# Patient Record
Sex: Female | Born: 1970 | Race: White | Hispanic: No | Marital: Married | Smoking: Never smoker
Health system: Southern US, Community
[De-identification: ages and names within clinical notes are randomized; demographics above are authoritative.]

## PROBLEM LIST (undated history)

## (undated) DIAGNOSIS — E669 Obesity, unspecified: Secondary | ICD-10-CM

## (undated) DIAGNOSIS — E119 Type 2 diabetes mellitus without complications: Secondary | ICD-10-CM

## (undated) DIAGNOSIS — K219 Gastro-esophageal reflux disease without esophagitis: Secondary | ICD-10-CM

## (undated) DIAGNOSIS — I10 Essential (primary) hypertension: Secondary | ICD-10-CM

## (undated) HISTORY — DX: Type 2 diabetes mellitus without complications: E11.9

## (undated) HISTORY — DX: Gastro-esophageal reflux disease without esophagitis: K21.9

## (undated) HISTORY — DX: Essential (primary) hypertension: I10

## (undated) HISTORY — PX: CHOLECYSTECTOMY: SHX55

## (undated) HISTORY — PX: WISDOM TOOTH EXTRACTION: SHX21

## (undated) HISTORY — PX: OTHER SURGICAL HISTORY: SHX169

## (undated) HISTORY — DX: Obesity, unspecified: E66.9

---

## 1998-04-11 ENCOUNTER — Other Ambulatory Visit: Admission: RE | Admit: 1998-04-11 | Discharge: 1998-04-11 | Payer: Self-pay | Admitting: *Deleted

## 1998-10-04 ENCOUNTER — Other Ambulatory Visit: Admission: RE | Admit: 1998-10-04 | Discharge: 1998-10-04 | Payer: Self-pay | Admitting: *Deleted

## 1998-11-16 ENCOUNTER — Other Ambulatory Visit: Admission: RE | Admit: 1998-11-16 | Discharge: 1998-11-16 | Payer: Self-pay | Admitting: *Deleted

## 1998-12-06 ENCOUNTER — Ambulatory Visit (HOSPITAL_COMMUNITY): Admission: RE | Admit: 1998-12-06 | Discharge: 1998-12-06 | Payer: Self-pay | Admitting: *Deleted

## 1999-04-18 ENCOUNTER — Ambulatory Visit (HOSPITAL_COMMUNITY): Admission: RE | Admit: 1999-04-18 | Discharge: 1999-04-18 | Payer: Self-pay | Admitting: Gastroenterology

## 1999-05-10 ENCOUNTER — Ambulatory Visit (HOSPITAL_COMMUNITY): Admission: RE | Admit: 1999-05-10 | Discharge: 1999-05-10 | Payer: Self-pay | Admitting: Obstetrics and Gynecology

## 1999-05-30 ENCOUNTER — Other Ambulatory Visit: Admission: RE | Admit: 1999-05-30 | Discharge: 1999-05-30 | Payer: Self-pay | Admitting: *Deleted

## 1999-06-24 ENCOUNTER — Other Ambulatory Visit: Admission: RE | Admit: 1999-06-24 | Discharge: 1999-06-24 | Payer: Self-pay | Admitting: *Deleted

## 1999-10-07 ENCOUNTER — Other Ambulatory Visit: Admission: RE | Admit: 1999-10-07 | Discharge: 1999-10-07 | Payer: Self-pay | Admitting: *Deleted

## 2000-04-30 ENCOUNTER — Ambulatory Visit (HOSPITAL_COMMUNITY): Admission: RE | Admit: 2000-04-30 | Discharge: 2000-04-30 | Payer: Self-pay | Admitting: *Deleted

## 2000-04-30 ENCOUNTER — Encounter (INDEPENDENT_AMBULATORY_CARE_PROVIDER_SITE_OTHER): Payer: Self-pay

## 2000-05-20 ENCOUNTER — Other Ambulatory Visit: Admission: RE | Admit: 2000-05-20 | Discharge: 2000-05-20 | Payer: Self-pay | Admitting: *Deleted

## 2001-06-07 ENCOUNTER — Inpatient Hospital Stay (HOSPITAL_COMMUNITY): Admission: AD | Admit: 2001-06-07 | Discharge: 2001-06-10 | Payer: Self-pay | Admitting: *Deleted

## 2001-07-16 ENCOUNTER — Other Ambulatory Visit: Admission: RE | Admit: 2001-07-16 | Discharge: 2001-07-16 | Payer: Self-pay | Admitting: *Deleted

## 2002-02-17 ENCOUNTER — Other Ambulatory Visit: Admission: RE | Admit: 2002-02-17 | Discharge: 2002-02-17 | Payer: Self-pay | Admitting: *Deleted

## 2002-06-10 ENCOUNTER — Encounter: Payer: Self-pay | Admitting: Obstetrics & Gynecology

## 2002-06-10 ENCOUNTER — Inpatient Hospital Stay (HOSPITAL_COMMUNITY): Admission: AD | Admit: 2002-06-10 | Discharge: 2002-06-10 | Payer: Self-pay | Admitting: Family Medicine

## 2002-09-14 ENCOUNTER — Inpatient Hospital Stay (HOSPITAL_COMMUNITY): Admission: AD | Admit: 2002-09-14 | Discharge: 2002-09-16 | Payer: Self-pay | Admitting: *Deleted

## 2002-10-14 ENCOUNTER — Other Ambulatory Visit: Admission: RE | Admit: 2002-10-14 | Discharge: 2002-10-14 | Payer: Self-pay | Admitting: *Deleted

## 2003-11-15 ENCOUNTER — Other Ambulatory Visit: Admission: RE | Admit: 2003-11-15 | Discharge: 2003-11-15 | Payer: Self-pay | Admitting: *Deleted

## 2004-05-13 ENCOUNTER — Encounter: Admission: RE | Admit: 2004-05-13 | Discharge: 2004-05-13 | Payer: Self-pay | Admitting: Gastroenterology

## 2004-07-02 ENCOUNTER — Encounter (INDEPENDENT_AMBULATORY_CARE_PROVIDER_SITE_OTHER): Payer: Self-pay | Admitting: *Deleted

## 2004-07-02 ENCOUNTER — Observation Stay (HOSPITAL_COMMUNITY): Admission: RE | Admit: 2004-07-02 | Discharge: 2004-07-03 | Payer: Self-pay | Admitting: Surgery

## 2004-09-11 ENCOUNTER — Encounter: Admission: RE | Admit: 2004-09-11 | Discharge: 2004-09-11 | Payer: Self-pay | Admitting: *Deleted

## 2004-12-27 ENCOUNTER — Other Ambulatory Visit: Admission: RE | Admit: 2004-12-27 | Discharge: 2004-12-27 | Payer: Self-pay | Admitting: Obstetrics and Gynecology

## 2007-01-06 ENCOUNTER — Other Ambulatory Visit: Admission: RE | Admit: 2007-01-06 | Discharge: 2007-01-06 | Payer: Self-pay | Admitting: Obstetrics and Gynecology

## 2008-01-26 ENCOUNTER — Other Ambulatory Visit: Admission: RE | Admit: 2008-01-26 | Discharge: 2008-01-26 | Payer: Self-pay | Admitting: Obstetrics and Gynecology

## 2008-10-25 ENCOUNTER — Ambulatory Visit (HOSPITAL_COMMUNITY): Admission: RE | Admit: 2008-10-25 | Discharge: 2008-10-25 | Payer: Self-pay | Admitting: Internal Medicine

## 2009-02-20 ENCOUNTER — Other Ambulatory Visit: Admission: RE | Admit: 2009-02-20 | Discharge: 2009-02-20 | Payer: Self-pay | Admitting: Obstetrics and Gynecology

## 2010-02-28 ENCOUNTER — Other Ambulatory Visit: Admission: RE | Admit: 2010-02-28 | Discharge: 2010-02-28 | Payer: Self-pay | Admitting: Obstetrics and Gynecology

## 2010-07-05 ENCOUNTER — Ambulatory Visit: Admission: RE | Admit: 2010-07-05 | Discharge: 2010-07-05 | Payer: Self-pay | Admitting: Gastroenterology

## 2010-07-05 ENCOUNTER — Encounter (INDEPENDENT_AMBULATORY_CARE_PROVIDER_SITE_OTHER): Payer: Self-pay | Admitting: Gastroenterology

## 2010-07-05 ENCOUNTER — Ambulatory Visit: Payer: Self-pay | Admitting: Vascular Surgery

## 2010-11-16 ENCOUNTER — Encounter: Payer: Self-pay | Admitting: *Deleted

## 2011-03-14 NOTE — Op Note (Signed)
NAME:  Christine Acevedo, Christine Acevedo                      ACCOUNT NO.:  [id number]   MEDICAL RECORD NO.:  [id number]                   PATIENT TYPE:  AMB   LOCATION:  DAY                                  FACILITY:  [facility]   PHYSICIAN:  Velora Heckler, M.D.                DATE OF BIRTH:  1970/11/28   DATE OF PROCEDURE:  07/02/2004  DATE OF DISCHARGE:                                 OPERATIVE REPORT   PREOPERATIVE DIAGNOSIS:  Symptomatic cholelithiasis.   POSTOPERATIVE DIAGNOSIS:  Symptomatic cholelithiasis.   PROCEDURE:  Laparoscopic cholecystectomy with intraoperative  cholangiography.   SURGEON:  Velora Heckler, M.D.   ASSISTANT:  Gita Kudo, M.D.   ANESTHESIA:  General.   ESTIMATED BLOOD LOSS:  Minimal.   PREPARATION:  Betadine.   COMPLICATIONS:  None.   INDICATIONS FOR PROCEDURE:  The patient is a 40 year old white female seen  at the request of Dr. Petra Kuba, with intermittent episodes of right  upper quadrant abdominal pain.  This has been associated with nausea.  The  patient underwent an ultrasound at [facility], which showed three large gallstones.  The gallbladder wall was thickened.  The patient now comes to surgery for a  cholecystectomy.   DESCRIPTION OF PROCEDURE:  The procedure is done in operating room #11 at  the [facility].  The patient is brought to the operating  room and placed in the supine position on the operating room table.  Following the administration of general anesthesia, the patient is prepped  and draped in the usual strict aseptic fashion.  After ascertaining that an  adequate level of anesthesia had been obtained, an infraumbilical incision  is made with a #15 blade.  Dissection is carried down to the fascia.  The  fascia is incised in the midline.  The peritoneal cavity is entered  cautiously, and #0 Vicryl pursestring suture is placed in the fascia.  A  Hasson cannula is introduced under direct vision and secured with a  pursestring suture.  The abdomen was then insufflated with carbon dioxide.  The laparoscope was introduced and the abdomen explored.  There is an  intrahepatic gallbladder which extends through the right lobe of the liver.  Operative ports are placed along the right costal margin in the midline,  midclavicular line and the anterior axillary line.  The gallbladder is  grasped and retracted upwards.  The neck of the gallbladder is exposed.  The  peritoneum is incised.  The cystic duct is dissected out along its length.  It is doubly clipped.  The cystic duct is incised.  A Cook cholangiography  catheter is introduced through a stab wound in the right upper quadrant and  inserted into the cystic duct.  It is secured with a Ligaclip.  Using C-arm  fluoroscopy, a real time cholangiography is performed.  There is rapid  filling of the normal caliber common bile duct.  There is  free flow distally  into the duodenum without filling defect or obstruction.  There is reflux of  contrast into the right and left hepatic ductal systems.  The clip is  withdrawn.  The [facility] catheter is removed from the peritoneal cavity.  The  cystic duct is triply clipped and divided.  The cystic artery is dissected  out, doubly clipped and divided.  The posterior branch is clipped and  divided.  The gallbladder is then excised with some difficulty from the  gallbladder bed using the hook electrocautery.  In the upper half of the  gallbladder, the gallbladder actually extends through the right hepatic  lobe.  It is separated on each side from the hepatic parenchyma using the  electrocautery.  A direct vessel into the posterior wall of the gallbladder  is divided between Ligaclips.  The gallbladder is completely excised and  extracted through the umbilical port.  It contained several large  gallstones.  The right upper quadrant is copiously irrigated with warm  saline.  The edge of the liver is cauterized with the  electrocautery to  achieve good hemostasis.  Fluid is evacuated.  The ports are removed under  direct vision.  Good hemostasis is noted at all port sites.  Pneumoperitoneum is released, and #0 Vicryl pursestring sutures are tied  securely. All port sites are anesthetized with local anesthetic.  All wounds  are closed with interrupted #4-0 Vicryl subcuticular sutures.  The wounds  were washed and dried, and Benzoin and Steri-Strips are applied.  Sterile  gauze dressings were applied.  The patient is awakened from anesthesia and brought to the recovery room in  stable condition.  The patient tolerated the procedure well.                                               Velora Heckler, M.D.    TMG/MEDQ  D:  07/02/2004  T:  07/02/2004  Job:  [id number]   cc:   Petra Kuba, M.D.  [address]  [place]  [place] [address]  Fax: [phone]

## 2011-03-14 NOTE — H&P (Signed)
   NAME:  Christine Acevedo, Christine Acevedo                      ACCOUNT NO.:  [id number]   MEDICAL RECORD NO.:  [id number]                   PATIENT TYPE:  INP   LOCATION:  9168                                 FACILITY:  [facility]   PHYSICIAN:  Tracie Harrier, M.D.              DATE OF BIRTH:  04-09-1971   DATE OF ADMISSION:  09/14/2002  DATE OF DISCHARGE:                                HISTORY & PHYSICAL   HISTORY OF PRESENT ILLNESS:  The patient is a 40 year old female, gravida 4,  para 1, at 40-2/[redacted] weeks gestation.  The patient was admitted for elective  induction of labor at term with a very favorable cervix.  Her pregnancy has  been complicated by an abnormal bowel appearance of the fetus on ultrasound.  She underwent a level II ultrasound which showed a questionable bowel  obstruction in the baby. This will be evaluated post delivery.   Otherwise her pregnancy has gone well and without complications.   OBSTETRIC LABS:  Maternal blood type B positive, rubella immune, group B  Strep is negative.  Glucola normal.   PAST MEDICAL HISTORY:  1. History of cervical dysplasia, status post cryo.  2. History of interstitial cystitis.   PAST SURGICAL HISTORY:  She had a D&C x2.   OBSTETRIC HISTORY:  1. SAB x2 with D&E x2.  2. January 01, 2002, a normal spontaneous vaginal delivery at 41 weeks of     female weighing 7 pounds 1 ounce.   CURRENT MEDICATIONS:  Prenatal vitamins.   ALLERGIES:  No known drug allergies.   PHYSICAL EXAMINATION:  GENERAL APPEARANCE:  She is a well-developed, well-  nourished female in no acute distress.  VITAL SIGNS:  Stable.  Temperature 98.7, blood pressure 131/70, fetal heart  tones 140s and reassuring.  HEENT:  Within normal limits.  NECK:  Supple without adenopathy or thyromegaly.  LUNGS:  Clear.  CARDIOVASCULAR:  Regular rate and rhythm without murmurs, rubs, or gallops.  BREASTS:  Examination is deferred.  ABDOMEN:  Gravid and nontender.  Estimated fetal weight  approximately 7 to 7-  1/2 pounds.  EXTREMITIES:  Grossly normal.  NEUROLOGIC:  Grossly normal.  PELVIC:  Normal external female genitalia.  Vagina clear. Cervix is 4 cm,  70%, vertex presentation, -2 station.  Artificial rupture of membranes shows  no fluid.   ADMISSION DIAGNOSES:  1. Intrauterine pregnancy at term.  2. Elective induction of labor.  3. Questionable fetal bowel obstruction.   PLAN:  1. Expect normal spontaneous vaginal delivery.  2. Induction of labor.                                               Tracie Harrier, M.D.    REG/MEDQ  D:  09/14/2002  T:  09/14/2002  Job:  [id number]

## 2011-03-14 NOTE — H&P (Signed)
[facility]  Patient:    Christine Acevedo, Christine Acevedo                   MRN: [id number] Adm. Date:  [id number] Attending:  Donne Hazel                         History and Physical  HISTORY OF PRESENT ILLNESS:   Ms. Kamiya is a 40 year old female, gravida 2, para 0, A2, at [redacted] weeks gestation.  The patient was seen routinely in the office on Tuesday, April 28, 2000, where she underwent an ultrasound which revealed an intrauterine demise at 6-weeks-plus gestation.  The patient has now had her second first trimester loss and is admitted for [facility]; also, tissue will be submitted for genetic evaluation to rule out causes of recurrent miscarriages.  MEDICAL HISTORY:              1. History of interstitial cystitis.                               2. History of atypical Pap smears.  SURGICAL HISTORY:             D&C, 2000.  OBSTETRICAL HISTORY:          SAB x 2 (first trimester).  CURRENT MEDICATIONS:          Prenatal vitamins.  ALLERGIES:                    None known.  PHYSICAL EXAMINATION:  VITAL SIGNS:                  Stable, temperature 97, pulse 63, respirations 16, blood pressure 128/77.  GENERAL:                      She is a well-developed, well-nourished female in no acute distress.  HEENT:                        Within normal limits.  NECK:                         Supple without adenopathy or thyromegaly.  HEART:                        Regular rate and rhythm without murmur, gallop or rub.  LUNGS:                        Clear to auscultation.  ABDOMEN:                      Without masses, tenderness, hernia or organomegaly.  BREASTS:                      Deferred.  EXTREMITIES:                  Grossly normal.  NEUROLOGIC:                   Grossly normal.  PELVIC:                       Normal external female genitalia.  Vagina and cervix clear.  The uterus is about 6 weeks in  size.  Adnexa is clear of mass.  ADMITTING DIAGNOSES:           1. Intrauterine fetal demise at 8 weeks.                               2. Recurrent miscarriage.  PLAN:                         1. D&E.                               2. Genetic evaluation of products of conception.  DISCUSSION:                   Risks and benefits of D&E discussed with the patient and her husband; the need for surgery and workup of causes of recurrent miscarriage reviewed.  Patient has blood type B-positive. DD:  04/30/00 TD:  04/30/00 Job: [id number]

## 2011-03-14 NOTE — Op Note (Signed)
[facility]  Patient:    Christine Acevedo, Christine Acevedo                   MRN: [id number] Proc. Date: 04/30/00 Adm. Date:  [id number] Attending:  Donne Hazel                           Operative Report  PREOPERATIVE DIAGNOSIS:       Intrauterine fetal demise at 8 weeks.  POSTOPERATIVE DIAGNOSIS:      Intrauterine fetal demise at 8 weeks.  PROCEDURE:                    Dilatation and evacuation.  SURGEON:                      Willey Blade, M.D.  ANESTHESIA:                   MAC plus local.  ESTIMATED BLOOD LOSS:         50 cc.  COMPLICATIONS:                None.  FINDINGS:                     Products of conception.  DESCRIPTION OF PROCEDURE:     The patient was taken to the operating room where a MAC was administered.  The patient was placed on the operating table in the dorsal lithotomy position.  The perineum and vagina were prepped and draped in the usual sterile fashion with Betadine and sterile drapes.  The anterior lip of the cervix was grasped with a single-tooth tenaculum.  A local anesthetic was infiltrated in the 3 and 9 oclock positions for a paracervical block; this was done with 10 cc of 1% Xylocaine.  The cervix was then serially dilated until a #23 Pratt dilator fit easily into the intracervical os.  Next, using a #7 curved suction curette, the uterus was systematically and repeatedly suction-curetted.  Products of conception were aspirated.  A small amount of products of conception were noted.  There were approximately five passes done with the suction curette.  The uterus was then sharply curetted with a small serrated curette and noted to be empty.  Two final passes were then made with the suction curette with minimal tissue and blood aspirated. All vaginal instruments were then removed.  The patient was awakened and taken to the recovery room in good condition.  There were no perioperative complications.  Blood type:   B-positive. DD:  04/30/00 TD:  04/30/00 Job: [id number]

## 2011-04-02 ENCOUNTER — Other Ambulatory Visit (HOSPITAL_COMMUNITY)
Admission: RE | Admit: 2011-04-02 | Discharge: 2011-04-02 | Disposition: A | Discharge status: Home / Self Care | Payer: PRIVATE HEALTH INSURANCE | Source: Ambulatory Visit | Attending: Obstetrics and Gynecology | Admitting: Obstetrics and Gynecology

## 2011-04-02 ENCOUNTER — Other Ambulatory Visit: Payer: Self-pay | Admitting: Obstetrics and Gynecology

## 2011-04-02 DIAGNOSIS — Z01419 Encounter for gynecological examination (general) (routine) without abnormal findings: Secondary | ICD-10-CM | POA: Insufficient documentation

## 2012-04-07 ENCOUNTER — Other Ambulatory Visit (HOSPITAL_COMMUNITY)
Admission: RE | Admit: 2012-04-07 | Discharge: 2012-04-07 | Disposition: A | Discharge status: Home / Self Care | Payer: PRIVATE HEALTH INSURANCE | Source: Ambulatory Visit | Attending: Obstetrics and Gynecology | Admitting: Obstetrics and Gynecology

## 2012-04-07 ENCOUNTER — Other Ambulatory Visit: Payer: Self-pay | Admitting: Obstetrics and Gynecology

## 2012-04-07 DIAGNOSIS — Z1159 Encounter for screening for other viral diseases: Secondary | ICD-10-CM | POA: Insufficient documentation

## 2012-04-07 DIAGNOSIS — Z01419 Encounter for gynecological examination (general) (routine) without abnormal findings: Secondary | ICD-10-CM | POA: Insufficient documentation

## 2013-02-14 ENCOUNTER — Other Ambulatory Visit: Payer: Self-pay | Admitting: Gastroenterology

## 2013-02-14 DIAGNOSIS — R109 Unspecified abdominal pain: Secondary | ICD-10-CM

## 2013-02-15 ENCOUNTER — Ambulatory Visit
Admission: RE | Admit: 2013-02-15 | Discharge: 2013-02-15 | Disposition: A | Discharge status: Home / Self Care | Payer: BC Managed Care – PPO | Source: Ambulatory Visit | Attending: Gastroenterology | Admitting: Gastroenterology

## 2013-02-15 DIAGNOSIS — R109 Unspecified abdominal pain: Secondary | ICD-10-CM

## 2013-02-15 MED ORDER — IOHEXOL 300 MG/ML  SOLN
125.0000 mL | Freq: Once | INTRAMUSCULAR | Status: AC | PRN
  Administered 2013-02-15: 125 mL via INTRAVENOUS

## 2013-09-13 ENCOUNTER — Other Ambulatory Visit: Payer: Self-pay | Admitting: Obstetrics and Gynecology

## 2013-09-13 ENCOUNTER — Other Ambulatory Visit (HOSPITAL_COMMUNITY)
Admission: RE | Admit: 2013-09-13 | Discharge: 2013-09-13 | Disposition: A | Discharge status: Home / Self Care | Payer: BC Managed Care – PPO | Source: Ambulatory Visit | Attending: Obstetrics and Gynecology | Admitting: Obstetrics and Gynecology

## 2013-09-13 DIAGNOSIS — Z01419 Encounter for gynecological examination (general) (routine) without abnormal findings: Secondary | ICD-10-CM | POA: Insufficient documentation

## 2013-09-21 ENCOUNTER — Other Ambulatory Visit: Payer: Self-pay

## 2013-09-21 DIAGNOSIS — Z1231 Encounter for screening mammogram for malignant neoplasm of breast: Secondary | ICD-10-CM

## 2013-10-28 ENCOUNTER — Ambulatory Visit
Admission: RE | Admit: 2013-10-28 | Discharge: 2013-10-28 | Disposition: A | Discharge status: Home / Self Care | Payer: BC Managed Care – PPO | Source: Ambulatory Visit

## 2013-10-28 DIAGNOSIS — Z1231 Encounter for screening mammogram for malignant neoplasm of breast: Secondary | ICD-10-CM

## 2013-11-03 ENCOUNTER — Other Ambulatory Visit: Payer: Self-pay | Admitting: Obstetrics and Gynecology

## 2013-11-03 DIAGNOSIS — R928 Other abnormal and inconclusive findings on diagnostic imaging of breast: Secondary | ICD-10-CM

## 2013-11-10 ENCOUNTER — Ambulatory Visit
Admission: RE | Admit: 2013-11-10 | Discharge: 2013-11-10 | Disposition: A | Discharge status: Home / Self Care | Payer: BC Managed Care – PPO | Source: Ambulatory Visit | Attending: Obstetrics and Gynecology | Admitting: Obstetrics and Gynecology

## 2013-11-10 DIAGNOSIS — R928 Other abnormal and inconclusive findings on diagnostic imaging of breast: Secondary | ICD-10-CM

## 2014-05-22 ENCOUNTER — Other Ambulatory Visit: Payer: Self-pay | Admitting: Gastroenterology

## 2014-05-22 DIAGNOSIS — R1084 Generalized abdominal pain: Secondary | ICD-10-CM

## 2014-05-25 ENCOUNTER — Ambulatory Visit
Admission: RE | Admit: 2014-05-25 | Discharge: 2014-05-25 | Disposition: A | Discharge status: Home / Self Care | Payer: BC Managed Care – PPO | Source: Ambulatory Visit | Attending: Gastroenterology | Admitting: Gastroenterology

## 2014-05-25 DIAGNOSIS — R1084 Generalized abdominal pain: Secondary | ICD-10-CM

## 2014-06-06 ENCOUNTER — Other Ambulatory Visit: Payer: Self-pay | Admitting: Gastroenterology

## 2014-09-14 ENCOUNTER — Other Ambulatory Visit (HOSPITAL_COMMUNITY)
Admission: RE | Admit: 2014-09-14 | Discharge: 2014-09-14 | Disposition: A | Discharge status: Home / Self Care | Payer: BC Managed Care – PPO | Source: Ambulatory Visit | Attending: Obstetrics and Gynecology | Admitting: Obstetrics and Gynecology

## 2014-09-14 ENCOUNTER — Other Ambulatory Visit: Payer: Self-pay | Admitting: Obstetrics and Gynecology

## 2014-09-14 DIAGNOSIS — Z01419 Encounter for gynecological examination (general) (routine) without abnormal findings: Secondary | ICD-10-CM | POA: Insufficient documentation

## 2014-09-15 LAB — CYTOLOGY - PAP

## 2014-10-25 ENCOUNTER — Other Ambulatory Visit: Payer: Self-pay

## 2014-10-25 DIAGNOSIS — Z1231 Encounter for screening mammogram for malignant neoplasm of breast: Secondary | ICD-10-CM

## 2014-10-30 ENCOUNTER — Ambulatory Visit
Admission: RE | Admit: 2014-10-30 | Discharge: 2014-10-30 | Disposition: A | Discharge status: Home / Self Care | Payer: BLUE CROSS/BLUE SHIELD | Source: Ambulatory Visit

## 2014-10-30 DIAGNOSIS — Z1231 Encounter for screening mammogram for malignant neoplasm of breast: Secondary | ICD-10-CM

## 2014-12-19 ENCOUNTER — Encounter: Payer: BLUE CROSS/BLUE SHIELD | Attending: Family Medicine

## 2014-12-19 VITALS — Ht 59.0 in | Wt 289.3 lb

## 2014-12-19 DIAGNOSIS — E119 Type 2 diabetes mellitus without complications: Secondary | ICD-10-CM

## 2014-12-19 DIAGNOSIS — Z713 Dietary counseling and surveillance: Secondary | ICD-10-CM | POA: Diagnosis present

## 2014-12-19 NOTE — Progress Notes (Signed)
Patient was seen on 12/19/14 for the first of a series of three diabetes self-management courses at the [facility].  Patient Education Plan per assessed needs and concerns is to attend four course education program for Diabetes Self Management Education.  The following learning objectives were met by the patient during this class:  Describe diabetes  State some common risk factors for diabetes  Defines the role of glucose and insulin  Identifies type of diabetes and pathophysiology  Describe the relationship between diabetes and cardiovascular risk  State the members of the Healthcare Team  States the rationale for glucose monitoring  State when to test glucose  State their individual Target Range  State the importance of logging glucose readings  Describe how to interpret glucose readings  Identifies A1C target  Explain the correlation between A1c and eAG values  State symptoms and treatment of high blood glucose  State symptoms and treatment of low blood glucose  Explain proper technique for glucose testing  Identifies proper sharps disposal  Handouts given during class include:  Living Well with Diabetes book  Carb Counting and Meal Planning book  Meal Plan Card  Carbohydrate guide  Meal planning worksheet  Low Sodium Flavoring Tips  The diabetes portion plate  P3I to eAG Conversion Chart  Diabetes Medications  Diabetes Recommended Care Schedule  Support Group  Diabetes Success Plan  Core Class Satisfaction Survey  Follow-Up Plan:  Attend core 2

## 2014-12-26 ENCOUNTER — Encounter: Payer: BLUE CROSS/BLUE SHIELD | Attending: Family Medicine

## 2014-12-26 DIAGNOSIS — Z713 Dietary counseling and surveillance: Secondary | ICD-10-CM | POA: Diagnosis not present

## 2014-12-26 DIAGNOSIS — E119 Type 2 diabetes mellitus without complications: Secondary | ICD-10-CM | POA: Diagnosis not present

## 2014-12-26 NOTE — Progress Notes (Signed)

## 2015-01-02 DIAGNOSIS — E119 Type 2 diabetes mellitus without complications: Secondary | ICD-10-CM

## 2015-01-02 DIAGNOSIS — Z713 Dietary counseling and surveillance: Secondary | ICD-10-CM | POA: Diagnosis not present

## 2015-01-02 NOTE — Progress Notes (Signed)
Patient was seen on 01/02/2015 for the third of a series of three diabetes self-management courses at the [facility]. The following learning objectives were met by the patient during this class:  . State the amount of activity recommended for healthy living . Describe activities suitable for individual needs . Identify ways to regularly incorporate activity into daily life . Identify barriers to activity and ways to over come these barriers  Identify diabetes medications being personally used and their primary action for lowering glucose and possible side effects . Describe role of stress on blood glucose and develop strategies to address psychosocial issues . Identify diabetes complications and ways to prevent them  Explain how to manage diabetes during illness . Evaluate success in meeting personal goal . Establish 2-3 goals that they will plan to diligently work on until they return for the  35-monthfollow-up visit  Goals:   I will count my carb choices at most meals and snacks  I will be active 30 minutes or more 3 times a week  I will take my diabetes medications as scheduled  I will test my glucose at least 2 times a day, 3-4 days a week  Your patient has identified these potential barriers to change:  Motivation Stress  Your patient has identified their diabetes self-care support plan as  Family Support On-line Resources Plan:  Attend Core 4 in 4 months

## 2015-04-18 ENCOUNTER — Ambulatory Visit: Payer: BLUE CROSS/BLUE SHIELD

## 2015-09-17 ENCOUNTER — Other Ambulatory Visit: Payer: Self-pay | Admitting: Obstetrics and Gynecology

## 2015-09-17 ENCOUNTER — Other Ambulatory Visit (HOSPITAL_COMMUNITY)
Admission: RE | Admit: 2015-09-17 | Discharge: 2015-09-17 | Disposition: A | Discharge status: Home / Self Care | Payer: BLUE CROSS/BLUE SHIELD | Source: Ambulatory Visit | Attending: Obstetrics and Gynecology | Admitting: Obstetrics and Gynecology

## 2015-09-17 DIAGNOSIS — Z01411 Encounter for gynecological examination (general) (routine) with abnormal findings: Secondary | ICD-10-CM | POA: Diagnosis present

## 2015-09-17 DIAGNOSIS — Z1151 Encounter for screening for human papillomavirus (HPV): Secondary | ICD-10-CM | POA: Insufficient documentation

## 2015-09-18 LAB — CYTOLOGY - PAP

## 2015-10-19 ENCOUNTER — Other Ambulatory Visit: Payer: Self-pay

## 2015-10-19 DIAGNOSIS — Z1231 Encounter for screening mammogram for malignant neoplasm of breast: Secondary | ICD-10-CM

## 2015-11-19 ENCOUNTER — Ambulatory Visit
Admission: RE | Admit: 2015-11-19 | Discharge: 2015-11-19 | Disposition: A | Discharge status: Home / Self Care | Payer: BLUE CROSS/BLUE SHIELD | Source: Ambulatory Visit

## 2015-11-19 DIAGNOSIS — Z1231 Encounter for screening mammogram for malignant neoplasm of breast: Secondary | ICD-10-CM

## 2016-09-24 ENCOUNTER — Other Ambulatory Visit (HOSPITAL_COMMUNITY)
Admission: RE | Admit: 2016-09-24 | Discharge: 2016-09-24 | Disposition: A | Discharge status: Home / Self Care | Payer: PRIVATE HEALTH INSURANCE | Source: Ambulatory Visit | Attending: Obstetrics and Gynecology | Admitting: Obstetrics and Gynecology

## 2016-09-24 ENCOUNTER — Other Ambulatory Visit: Payer: Self-pay | Admitting: Obstetrics and Gynecology

## 2016-09-24 DIAGNOSIS — Z01419 Encounter for gynecological examination (general) (routine) without abnormal findings: Secondary | ICD-10-CM | POA: Insufficient documentation

## 2016-09-26 LAB — CYTOLOGY - PAP: Diagnosis: NEGATIVE

## 2016-11-18 ENCOUNTER — Other Ambulatory Visit: Payer: Self-pay | Admitting: Obstetrics and Gynecology

## 2016-11-18 DIAGNOSIS — Z1231 Encounter for screening mammogram for malignant neoplasm of breast: Secondary | ICD-10-CM

## 2016-12-15 ENCOUNTER — Ambulatory Visit
Admission: RE | Admit: 2016-12-15 | Discharge: 2016-12-15 | Disposition: A | Discharge status: Home / Self Care | Payer: PRIVATE HEALTH INSURANCE | Source: Ambulatory Visit | Attending: Obstetrics and Gynecology | Admitting: Obstetrics and Gynecology

## 2016-12-15 DIAGNOSIS — Z1231 Encounter for screening mammogram for malignant neoplasm of breast: Secondary | ICD-10-CM

## 2016-12-17 ENCOUNTER — Other Ambulatory Visit: Payer: Self-pay | Admitting: Obstetrics and Gynecology

## 2016-12-17 DIAGNOSIS — R928 Other abnormal and inconclusive findings on diagnostic imaging of breast: Secondary | ICD-10-CM

## 2016-12-24 ENCOUNTER — Ambulatory Visit
Admission: RE | Admit: 2016-12-24 | Discharge: 2016-12-24 | Disposition: A | Discharge status: Home / Self Care | Payer: PRIVATE HEALTH INSURANCE | Source: Ambulatory Visit | Attending: Obstetrics and Gynecology | Admitting: Obstetrics and Gynecology

## 2016-12-24 DIAGNOSIS — R928 Other abnormal and inconclusive findings on diagnostic imaging of breast: Secondary | ICD-10-CM

## 2017-02-13 ENCOUNTER — Other Ambulatory Visit: Payer: Self-pay | Admitting: Obstetrics and Gynecology

## 2017-02-13 DIAGNOSIS — N641 Fat necrosis of breast: Secondary | ICD-10-CM

## 2017-03-18 ENCOUNTER — Other Ambulatory Visit: Payer: Self-pay | Admitting: Obstetrics and Gynecology

## 2017-03-18 ENCOUNTER — Ambulatory Visit
Admission: RE | Admit: 2017-03-18 | Discharge: 2017-03-18 | Disposition: A | Discharge status: Home / Self Care | Payer: PRIVATE HEALTH INSURANCE | Source: Ambulatory Visit | Attending: Obstetrics and Gynecology | Admitting: Obstetrics and Gynecology

## 2017-03-18 DIAGNOSIS — N641 Fat necrosis of breast: Secondary | ICD-10-CM

## 2017-03-18 DIAGNOSIS — N6310 Unspecified lump in the right breast, unspecified quadrant: Secondary | ICD-10-CM

## 2017-03-19 ENCOUNTER — Ambulatory Visit
Admission: RE | Admit: 2017-03-19 | Discharge: 2017-03-19 | Disposition: A | Discharge status: Home / Self Care | Payer: PRIVATE HEALTH INSURANCE | Source: Ambulatory Visit | Attending: Obstetrics and Gynecology | Admitting: Obstetrics and Gynecology

## 2017-03-19 ENCOUNTER — Other Ambulatory Visit: Payer: Self-pay | Admitting: Obstetrics and Gynecology

## 2017-03-19 DIAGNOSIS — N6310 Unspecified lump in the right breast, unspecified quadrant: Secondary | ICD-10-CM

## 2017-03-19 HISTORY — PX: BREAST BIOPSY: SHX20

## 2017-11-11 ENCOUNTER — Other Ambulatory Visit: Payer: Self-pay | Admitting: Obstetrics and Gynecology

## 2017-11-11 DIAGNOSIS — Z1231 Encounter for screening mammogram for malignant neoplasm of breast: Secondary | ICD-10-CM

## 2017-11-25 ENCOUNTER — Other Ambulatory Visit: Payer: Self-pay | Admitting: Obstetrics and Gynecology

## 2017-11-25 ENCOUNTER — Other Ambulatory Visit (HOSPITAL_COMMUNITY)
Admission: RE | Admit: 2017-11-25 | Discharge: 2017-11-25 | Disposition: A | Discharge status: Home / Self Care | Payer: PRIVATE HEALTH INSURANCE | Source: Ambulatory Visit | Attending: Obstetrics and Gynecology | Admitting: Obstetrics and Gynecology

## 2017-11-25 DIAGNOSIS — Z124 Encounter for screening for malignant neoplasm of cervix: Secondary | ICD-10-CM | POA: Diagnosis not present

## 2017-11-27 LAB — CYTOLOGY - PAP
DIAGNOSIS: NEGATIVE
HPV: NOT DETECTED

## 2017-12-21 ENCOUNTER — Ambulatory Visit
Admission: RE | Admit: 2017-12-21 | Discharge: 2017-12-21 | Disposition: A | Discharge status: Home / Self Care | Payer: PRIVATE HEALTH INSURANCE | Source: Ambulatory Visit | Attending: Obstetrics and Gynecology | Admitting: Obstetrics and Gynecology

## 2017-12-21 DIAGNOSIS — Z1231 Encounter for screening mammogram for malignant neoplasm of breast: Secondary | ICD-10-CM

## 2018-01-06 ENCOUNTER — Encounter: Payer: PRIVATE HEALTH INSURANCE | Attending: Family Medicine | Admitting: Skilled Nursing Facility1

## 2018-01-06 ENCOUNTER — Encounter: Payer: Self-pay | Admitting: Skilled Nursing Facility1

## 2018-01-06 DIAGNOSIS — E119 Type 2 diabetes mellitus without complications: Secondary | ICD-10-CM | POA: Insufficient documentation

## 2018-01-06 DIAGNOSIS — Z713 Dietary counseling and surveillance: Secondary | ICD-10-CM | POA: Insufficient documentation

## 2018-01-06 NOTE — Progress Notes (Signed)
Pt states she needs to lose weight. Pt states he was drinking a lot of orange juice during the winter to boost her immune system so now taking vitmain C tablets. Pt states her A1C 7.4.  Pt states she gets hypoglycemic symptoms at 90 (run down and tired). Pt states she feels her metformin is getting stuck in her throat so she cuts them in half stating it is coming out in her stool. Pt states she has GERD. Pt states she skips meals often resulting in low blood sugar throughout the week. Pt states she does not like meat. Pt states she eats out fridays and saturdays. 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  Pt states she takes medicine to stop her from having diarrhea stating this is normal due to having her gallbladder removed.  Aiming for 4 days a week 30 minute walks. Pt states she will start checking her blood sugar ans bring the readings to the next appointment.   Goals: -Check your blood sugar 2 times a day: fasting and 2 hours after a meal -Walk 4 days a week for 30 minutes with your son -Always bring your meter with you everywhere you go -Always Properly dispose of your needles:  -Discard in a hard plastic/metal container with a lid (something the needle can't puncture)  -Write Do Not Recycle on the outside of the container  -Example: A laundry detergent bottle -Never use the same needle more than once -Eat 2-3 carbohydrate choices for each meal and 1 for each snack -A meal: carbohydrates, protein, vegetable -A snack: A Fruit OR Vegetable AND Protein  -Try to be more active -Always pay attention to your body keeping watchful of possible low blood sugar (below 70) or high blood sugar (above 200) -Check your feet every day looking for anything that was not there the day before

## 2018-01-07 NOTE — Patient Instructions (Signed)
-  Always bring your meter with you everywhere you go -Always Properly dispose of your needles:  -Discard in a hard plastic/metal container with a lid (something the needle can't puncture)  -Write Do Not Recycle on the outside of the container  -Example: A laundry detergent bottle -Never use the same needle more than once -Eat 2-3 carbohydrate choices for each meal and 1 for each snack -A meal: carbohydrates, protein, vegetable -A snack: A Fruit OR Vegetable AND Protein  -Try to be more active -Always pay attention to your body keeping watchful of possible low blood sugar (below 70) or high blood sugar (above 200)  -Check your feet every day looking for anything that was not there the day before  

## 2018-02-17 ENCOUNTER — Ambulatory Visit: Payer: PRIVATE HEALTH INSURANCE | Admitting: Skilled Nursing Facility1

## 2018-06-25 ENCOUNTER — Other Ambulatory Visit: Payer: Self-pay | Admitting: Gastroenterology

## 2018-06-25 DIAGNOSIS — R1311 Dysphagia, oral phase: Secondary | ICD-10-CM

## 2018-06-29 ENCOUNTER — Ambulatory Visit
Admission: RE | Admit: 2018-06-29 | Discharge: 2018-06-29 | Disposition: A | Discharge status: Home / Self Care | Payer: PRIVATE HEALTH INSURANCE | Source: Ambulatory Visit | Attending: Gastroenterology | Admitting: Gastroenterology

## 2018-06-29 DIAGNOSIS — R1311 Dysphagia, oral phase: Secondary | ICD-10-CM

## 2018-12-03 ENCOUNTER — Other Ambulatory Visit: Payer: Self-pay | Admitting: Obstetrics and Gynecology

## 2018-12-03 DIAGNOSIS — Z1231 Encounter for screening mammogram for malignant neoplasm of breast: Secondary | ICD-10-CM

## 2018-12-31 ENCOUNTER — Ambulatory Visit
Admission: RE | Admit: 2018-12-31 | Discharge: 2018-12-31 | Disposition: A | Discharge status: Home / Self Care | Payer: PRIVATE HEALTH INSURANCE | Source: Ambulatory Visit | Attending: Obstetrics and Gynecology | Admitting: Obstetrics and Gynecology

## 2018-12-31 DIAGNOSIS — Z1231 Encounter for screening mammogram for malignant neoplasm of breast: Secondary | ICD-10-CM

## 2019-09-06 ENCOUNTER — Other Ambulatory Visit: Payer: Self-pay | Admitting: Physician Assistant

## 2019-09-06 DIAGNOSIS — D171 Benign lipomatous neoplasm of skin and subcutaneous tissue of trunk: Secondary | ICD-10-CM

## 2019-09-06 DIAGNOSIS — R1900 Intra-abdominal and pelvic swelling, mass and lump, unspecified site: Secondary | ICD-10-CM

## 2019-09-06 DIAGNOSIS — R1011 Right upper quadrant pain: Secondary | ICD-10-CM

## 2019-09-06 DIAGNOSIS — K760 Fatty (change of) liver, not elsewhere classified: Secondary | ICD-10-CM

## 2019-09-08 ENCOUNTER — Other Ambulatory Visit: Payer: Self-pay | Admitting: Physician Assistant

## 2019-09-08 DIAGNOSIS — D171 Benign lipomatous neoplasm of skin and subcutaneous tissue of trunk: Secondary | ICD-10-CM

## 2019-09-08 DIAGNOSIS — R1900 Intra-abdominal and pelvic swelling, mass and lump, unspecified site: Secondary | ICD-10-CM

## 2019-09-08 DIAGNOSIS — R1011 Right upper quadrant pain: Secondary | ICD-10-CM

## 2019-09-08 DIAGNOSIS — K760 Fatty (change of) liver, not elsewhere classified: Secondary | ICD-10-CM

## 2019-09-19 ENCOUNTER — Other Ambulatory Visit: Payer: PRIVATE HEALTH INSURANCE

## 2019-09-20 ENCOUNTER — Ambulatory Visit
Admission: RE | Admit: 2019-09-20 | Discharge: 2019-09-20 | Disposition: A | Discharge status: Home / Self Care | Payer: PRIVATE HEALTH INSURANCE | Source: Ambulatory Visit | Attending: Physician Assistant | Admitting: Physician Assistant

## 2019-09-20 DIAGNOSIS — D171 Benign lipomatous neoplasm of skin and subcutaneous tissue of trunk: Secondary | ICD-10-CM

## 2019-09-20 DIAGNOSIS — R1011 Right upper quadrant pain: Secondary | ICD-10-CM

## 2019-09-20 DIAGNOSIS — K760 Fatty (change of) liver, not elsewhere classified: Secondary | ICD-10-CM

## 2019-09-20 DIAGNOSIS — R1900 Intra-abdominal and pelvic swelling, mass and lump, unspecified site: Secondary | ICD-10-CM

## 2019-11-25 ENCOUNTER — Other Ambulatory Visit: Payer: Self-pay | Admitting: Obstetrics and Gynecology

## 2019-11-25 DIAGNOSIS — Z1231 Encounter for screening mammogram for malignant neoplasm of breast: Secondary | ICD-10-CM

## 2020-01-13 ENCOUNTER — Other Ambulatory Visit: Payer: Self-pay

## 2020-01-13 ENCOUNTER — Ambulatory Visit
Admission: RE | Admit: 2020-01-13 | Discharge: 2020-01-13 | Disposition: A | Discharge status: Home / Self Care | Payer: PRIVATE HEALTH INSURANCE | Source: Ambulatory Visit | Attending: Obstetrics and Gynecology | Admitting: Obstetrics and Gynecology

## 2020-01-13 DIAGNOSIS — Z1231 Encounter for screening mammogram for malignant neoplasm of breast: Secondary | ICD-10-CM

## 2020-02-09 ENCOUNTER — Other Ambulatory Visit: Payer: Self-pay | Admitting: Physician Assistant

## 2020-02-09 DIAGNOSIS — R1900 Intra-abdominal and pelvic swelling, mass and lump, unspecified site: Secondary | ICD-10-CM

## 2020-02-09 DIAGNOSIS — D171 Benign lipomatous neoplasm of skin and subcutaneous tissue of trunk: Secondary | ICD-10-CM

## 2020-02-23 ENCOUNTER — Ambulatory Visit
Admission: RE | Admit: 2020-02-23 | Discharge: 2020-02-23 | Disposition: A | Discharge status: Home / Self Care | Payer: PRIVATE HEALTH INSURANCE | Source: Ambulatory Visit | Attending: Physician Assistant | Admitting: Physician Assistant

## 2020-02-23 DIAGNOSIS — D171 Benign lipomatous neoplasm of skin and subcutaneous tissue of trunk: Secondary | ICD-10-CM

## 2020-02-23 DIAGNOSIS — R1900 Intra-abdominal and pelvic swelling, mass and lump, unspecified site: Secondary | ICD-10-CM

## 2020-02-23 MED ORDER — GADOBENATE DIMEGLUMINE 529 MG/ML IV SOLN
20.0000 mL | Freq: Once | INTRAVENOUS | Status: AC | PRN
  Administered 2020-02-23: 20 mL via INTRAVENOUS

## 2020-03-10 ENCOUNTER — Other Ambulatory Visit: Payer: PRIVATE HEALTH INSURANCE

## 2020-10-31 ENCOUNTER — Other Ambulatory Visit: Payer: Self-pay | Admitting: Family Medicine

## 2020-10-31 ENCOUNTER — Ambulatory Visit
Admission: RE | Admit: 2020-10-31 | Discharge: 2020-10-31 | Disposition: A | Discharge status: Home / Self Care | Payer: PRIVATE HEALTH INSURANCE | Source: Ambulatory Visit | Attending: Family Medicine | Admitting: Family Medicine

## 2020-10-31 DIAGNOSIS — M7989 Other specified soft tissue disorders: Secondary | ICD-10-CM

## 2020-11-14 ENCOUNTER — Other Ambulatory Visit: Payer: Self-pay

## 2020-11-14 DIAGNOSIS — I8391 Asymptomatic varicose veins of right lower extremity: Secondary | ICD-10-CM

## 2020-11-25 IMAGING — MG DIGITAL SCREENING BILATERAL MAMMOGRAM WITH TOMO AND CAD
6 of 10 series · 6 of 30 positions shown · non-contrast
Comparison: Previous exam(s).

ACR Breast Density Category a: The breast tissue is almost entirely
fatty.

CLINICAL DATA: Screening.

EXAM:
DIGITAL SCREENING BILATERAL MAMMOGRAM WITH TOMO AND CAD

[L MLO synth-2D]
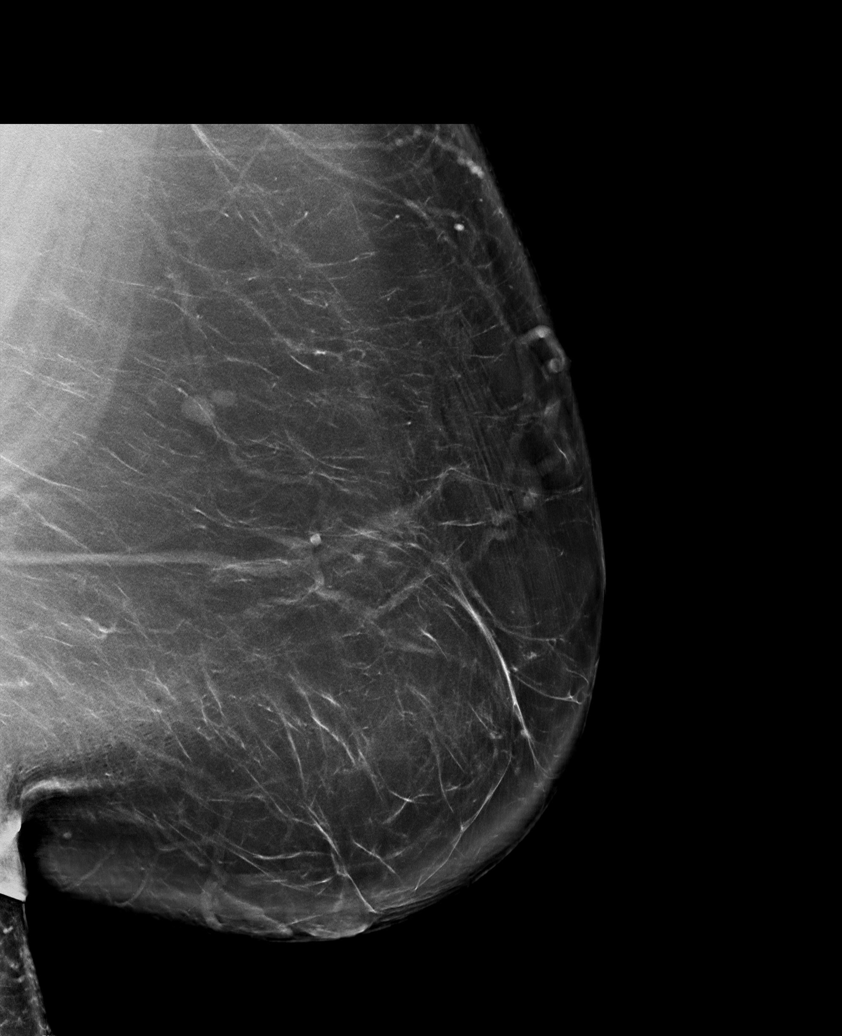

[R CV synth-2D]
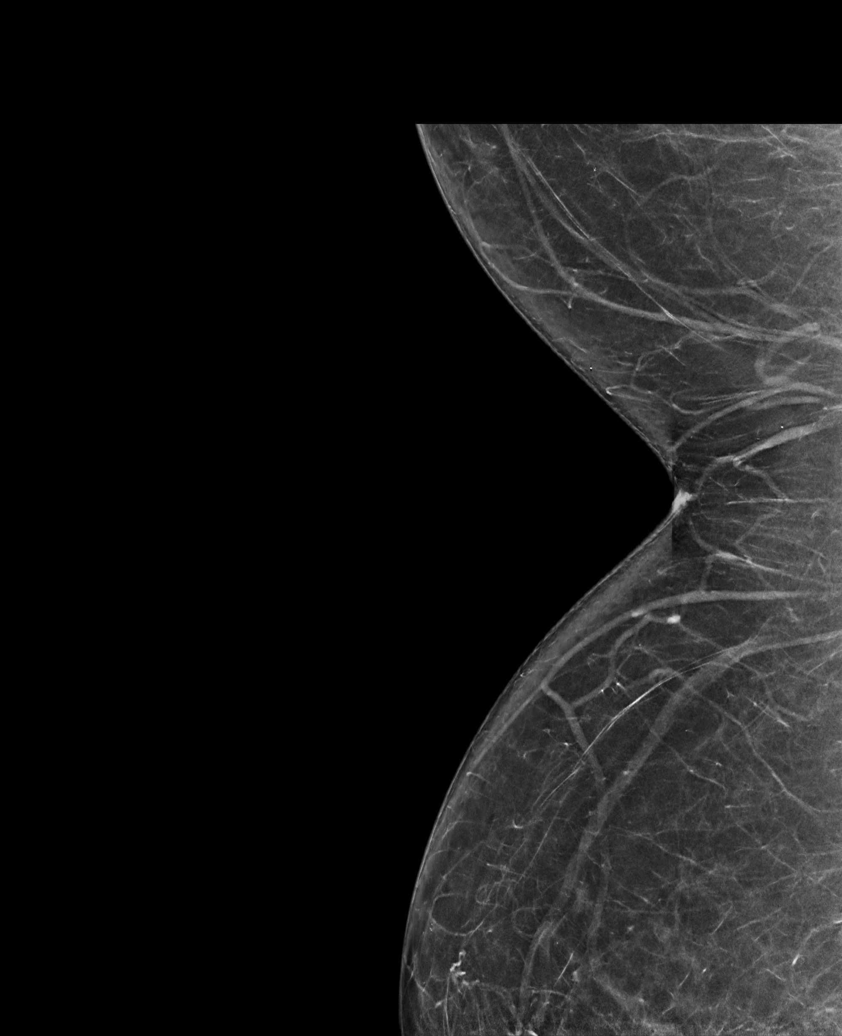

[R MLO synth-2D]
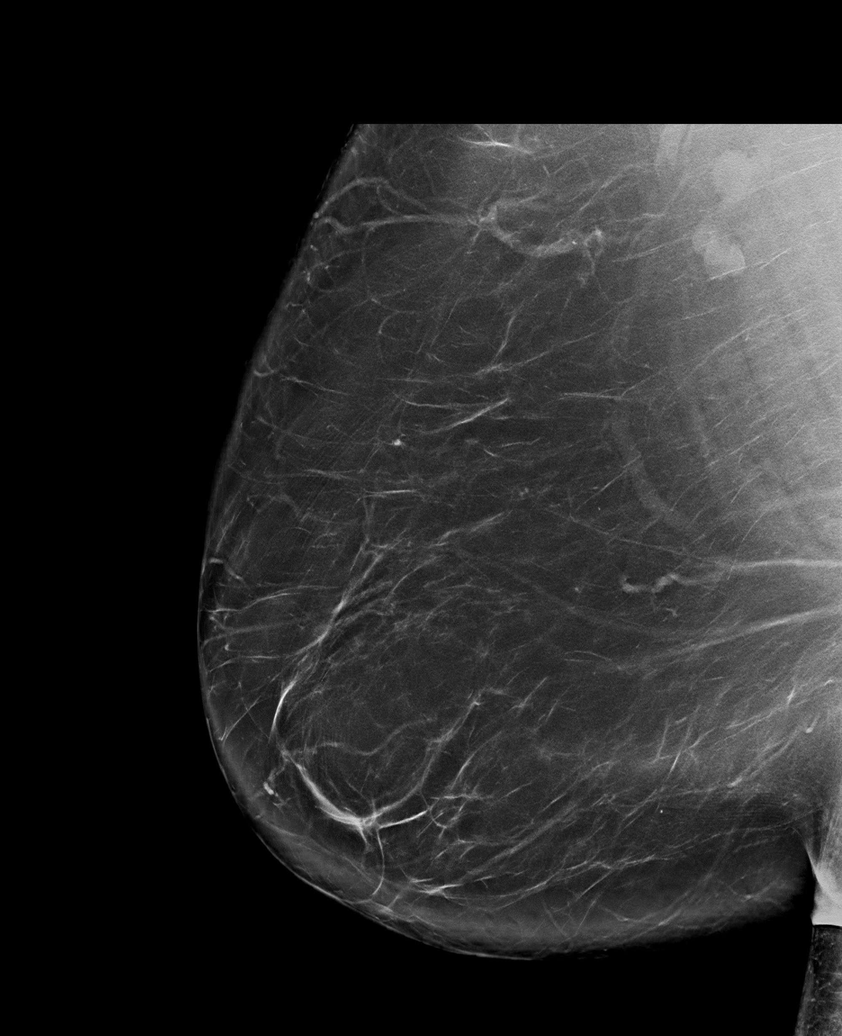

[L CC synth-2D]
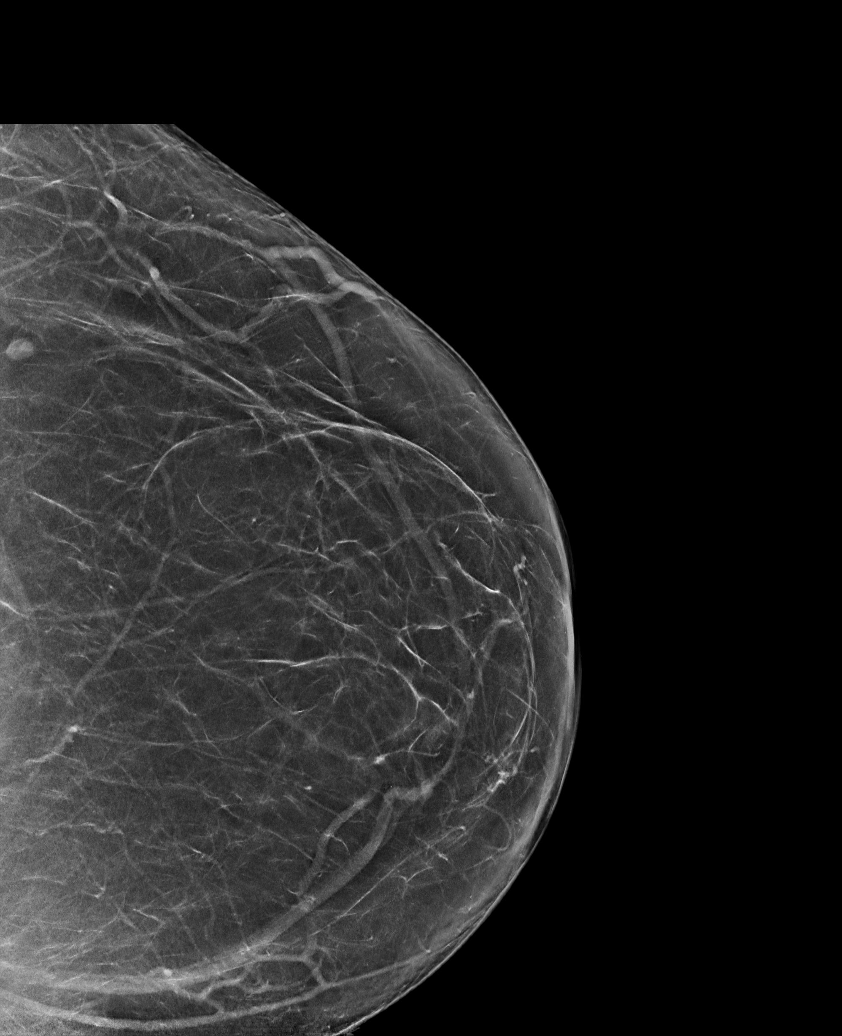

[R CC synth-2D]
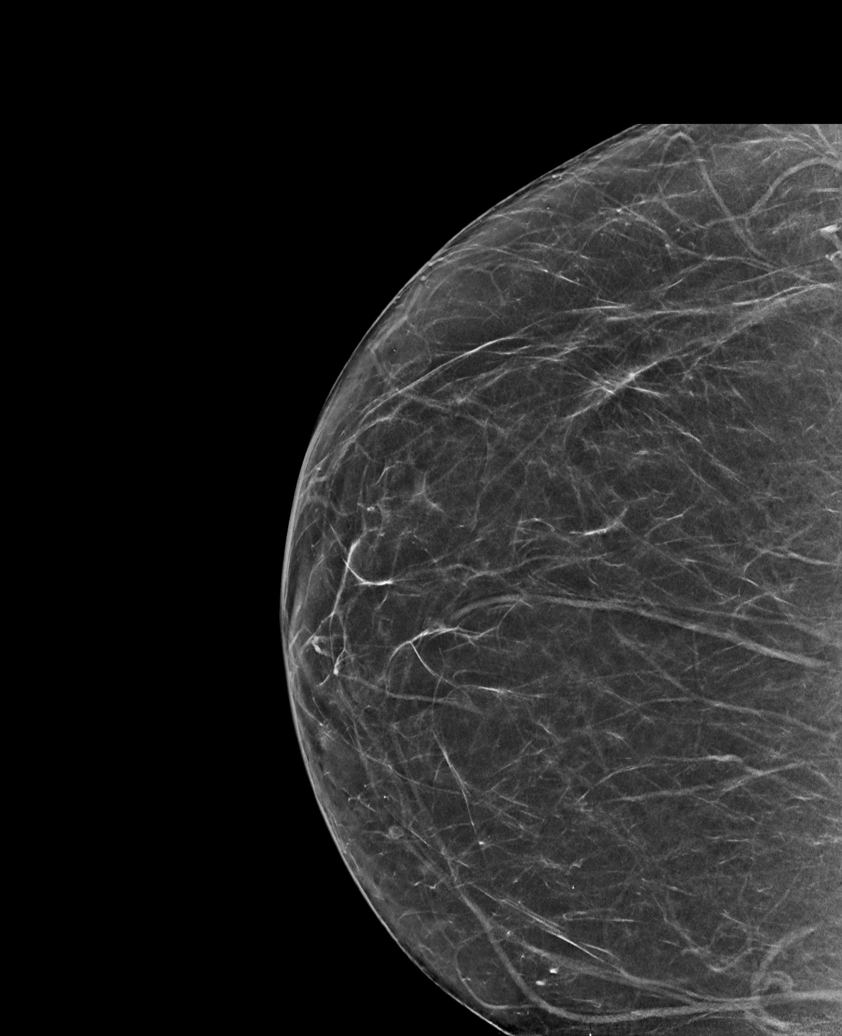

[L CC tomo · tomo slice 43/84.0]
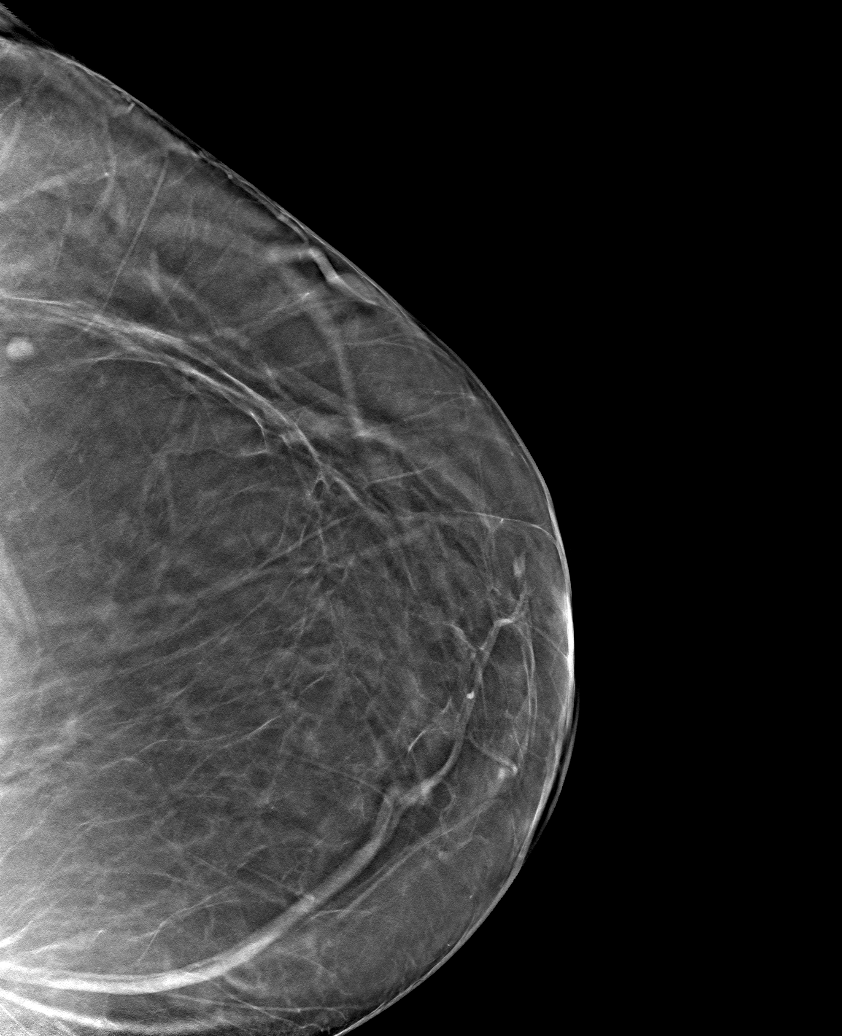

[6 of 30 positions shown; findings below may reference images not displayed]

FINDINGS: There are no findings suspicious for malignancy. Images were
processed with CAD.
IMPRESSION: No mammographic evidence of malignancy. A result letter of this
screening mammogram will be mailed directly to the patient.

RECOMMENDATION:
Screening mammogram in one year. (Code:[id number])

BI-RADS CATEGORY  1: Negative.

## 2020-12-10 ENCOUNTER — Other Ambulatory Visit: Payer: Self-pay

## 2020-12-10 ENCOUNTER — Ambulatory Visit (HOSPITAL_COMMUNITY)
Admission: RE | Admit: 2020-12-10 | Discharge: 2020-12-10 | Disposition: A | Discharge status: Home / Self Care | Payer: PRIVATE HEALTH INSURANCE | Source: Ambulatory Visit | Attending: Surgery | Admitting: Surgery

## 2020-12-10 ENCOUNTER — Ambulatory Visit (INDEPENDENT_AMBULATORY_CARE_PROVIDER_SITE_OTHER): Payer: PRIVATE HEALTH INSURANCE | Admitting: Physician Assistant

## 2020-12-10 VITALS — BP 131/84 | HR 83 | Temp 98.6°F | Resp 20 | Ht 59.0 in | Wt 277.4 lb

## 2020-12-10 DIAGNOSIS — I8391 Asymptomatic varicose veins of right lower extremity: Secondary | ICD-10-CM | POA: Insufficient documentation

## 2020-12-10 DIAGNOSIS — I8001 Phlebitis and thrombophlebitis of superficial vessels of right lower extremity: Secondary | ICD-10-CM

## 2020-12-10 NOTE — Progress Notes (Signed)
[facility]           [facility] [place]  History and Physical   Christine Acevedo is a 50 y.o. female who presents with hx of superficial thrombophlebitis.  She states that she started to notice this back in December.  She developed pain and redness in the area just below her knee medial/posterior on the right.  It was painful to touch.  She was getting ready to go on an 8 hour car ride to [place], but did not go.   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 Again, there is no extension of this occlusive SVT to the deep venous system of the right lower extremity.  There was no evidence for DVT.  She was scheduled to f/u with Vascular surgery and is here today with follow up u/s.    She states the pain has greatly improved and it only hurts now to touch.  She states at the time, she could lay on that side, it was reddened and her husband felt it was a little swollen.  She has never had any hx of blood clots.  She states that she has always had a varicose vein at the location of concern in the past but it never had a cord like structure or knot.  She does not recall any injury to her leg, she does not have any hx of clotting disorders or family hx that she is aware of.  Her mother has Thalassemia and she states she was tested when she was a baby and was negative for this.  She does take birth control.  She denies any chest pain or shortness of breath.  She started taking a full dose aspirin.  She works for [employer] and is on her feet a great deal.     The pt is not on a statin for cholesterol management.  The pt is not on a daily aspirin.   Other AC:  none The pt is not on medication for hypertension.   The pt is diabetic.   Tobacco hx:  never   Past Medical History:  Diagnosis Date  . Diabetes mellitus without  complication ([place])   . GERD (gastroesophageal reflux disease)   . Hypertension   . Obesity     Past Surgical History:  Procedure Laterality Date  . BREAST BIOPSY Right 03/19/2017  . CHOLECYSTECTOMY    . pilonidal cysts    . WISDOM TOOTH EXTRACTION      Social History   Socioeconomic History  . Marital status: Married    Spouse name: Not on file  . Number of children: 2  . Years of education: Not on file  . Highest education level: Not on file  Occupational History  . Not on file  Tobacco Use  . Smoking status: Never Smoker  . Smokeless tobacco: Never Used  Substance and Sexual Activity  . Alcohol use: Yes    Comment: Occassional  . Drug use: Not on file  . Sexual activity: Not on file  Other Topics Concern  . Not on file  Social History Narrative  . Not on file   Social Determinants of Health   Financial Resource Strain: Not on file  Food Insecurity: Not on file  Transportation Needs: Not on file  Physical Activity:  Not on file  Stress: Not on file  Social Connections: Not on file  Intimate Partner Violence: Not on file    Family History  Problem Relation Age of Onset  . Breast cancer Paternal Grandmother        late 33's    Current Outpatient Medications  Medication Sig Dispense Refill  . aspirin 325 MG tablet Take 325 mg by mouth daily.    . benazepril-hydrochlorthiazide (LOTENSIN HCT) 10-12.5 MG per tablet Take 1 tablet by mouth daily.    . cetirizine (ZYRTEC) 10 MG tablet Take 10 mg by mouth daily.    . famotidine (PEPCID) 20 MG tablet Take 20 mg by mouth 2 (two) times daily.    Marland Kitchen omeprazole (PRILOSEC) 20 MG capsule Take 20 mg by mouth daily.    . calcium-vitamin D (OSCAL WITH D) 250-125 MG-UNIT per tablet Take 1 tablet by mouth daily. (Patient not taking: Reported on 12/10/2020)    . colestipol (COLESTID) 1 g tablet Take 1 g by mouth 2 (two) times daily as needed.    Marland Kitchen esomeprazole (NEXIUM) 40 MG capsule Take 40 mg by mouth daily at 12 noon. (Patient  not taking: Reported on 12/10/2020)    . metFORMIN (GLUCOPHAGE) 500 MG tablet Take by mouth 2 (two) times daily with a meal.    . Multiple Vitamin (MULTIVITAMIN) tablet Take 1 tablet by mouth daily. (Patient not taking: Reported on 12/10/2020)    . norethindrone (MICRONOR) 0.35 MG tablet Take 1 tablet by mouth daily.    . TRULICITY 1.5 AL/9.3XT SOPN Inject 1.5 mg into the skin once a week.     No current facility-administered medications for this visit.    No Known Allergies  REVIEW OF SYSTEMS:   [X]  denotes positive finding, [ ]  denotes negative finding Cardiac  Comments:  Chest pain or chest pressure:    Shortness of breath upon exertion:    Short of breath when lying flat:    Irregular heart rhythm:        Vascular    Pain in calf, thigh, or hip brought on by ambulation:    Pain in feet at night that wakes you up from your sleep:     Blood clot in your veins: x See HPI  Leg swelling:         Pulmonary    Oxygen at home:    Productive cough:     Wheezing:         Neurologic    Sudden weakness in arms or legs:     Sudden numbness in arms or legs:     Sudden onset of difficulty speaking or slurred speech:    Temporary loss of vision in one eye:     Problems with dizziness:         Gastrointestinal    Blood in stool:     Vomited blood:         Genitourinary    Burning when urinating:     Blood in urine:        Psychiatric    Major depression:         Hematologic    Bleeding problems:    Problems with blood clotting too easily:        Skin    Rashes or ulcers:        Constitutional    Fever or chills:      PHYSICAL EXAMINATION:  Today's Vitals   12/10/20 1517  BP: 131/84  Pulse: 83  Resp: 20  Temp: 98.6 F (37 C)  TempSrc: Temporal  SpO2: 96%  Weight: 277 lb 6.4 oz (125.8 kg)  Height: 4\' 11"  (1.499 m)  PainSc: 6   PainLoc: Leg   Body mass index is 56.03 kg/m.   General:  WDWN in NAD; vital signs documented above Gait: Not observed HENT:  WNL, normocephalic Pulmonary: normal non-labored breathing without wheezing Cardiac: regular HR; without carotid bruits Abdomen: obese Skin: without rashes Vascular Exam/Pulses:  Right Left  Radial 2+ (normal) 2+ (normal)  DP 2+ (normal) 2+ (normal)  PT 2+ (normal) Unable to palpate   Extremities: without ischemic changes, without cellulitis; without open wounds; without skin color changes; she does have a palpable cord on the medial/posterior aspect of the right leg.  There is no redness but mild discoloration.   Musculoskeletal: no muscle wasting or atrophy  Neurologic: A&O X 3;  moving all extremities equally Psychiatric:  The pt has Normal affect.   Non-Invasive Vascular Imaging:   Venous duplex on 12/10/2020: Venous Reflux Times  +--------------+---------+------+-----------+------------+--------------+  RIGHT     Reflux NoRefluxReflux TimeDiameter cmsComments                  Yes                      +--------------+---------+------+-----------+------------+--------------+  GSV at SFJ                  0.64           +--------------+---------+------+-----------+------------+--------------+  GSV prox thigh      yes  >500 ms   0.53           +--------------+---------+------+-----------+------------+--------------+  GSV mid thigh       yes  >500 ms   0.60           +--------------+---------+------+-----------+------------+--------------+  GSV dist thigh      yes  >500 ms   0.57           +--------------+---------+------+-----------+------------+--------------+  GSV at knee        yes  >500 ms   0.55           +--------------+---------+------+-----------+------------+--------------+  GSV prox calf                 0.22  acute thrombus   +--------------+---------+------+-----------+------------+--------------+  GSV mid calf                 0.25  acute thrombus  +--------------+---------+------+-----------+------------+--------------+  SSV Pop Fossa       yes  >500 ms   0.27           +--------------+---------+------+-----------+------------+--------------+  SSV prox calf       yes  >500 ms   0.27           +--------------+---------+------+-----------+------------+--------------+  SSV mid calf                 0.30           +--------------+---------+------+-----------+------------+--------------+  AASV                     .29            +--------------+---------+------+-----------+------------+--------------+   Summary:  Right:  - No evidence of deep vein thrombosis seen in the right lower extremity,  from the common femoral through the popliteal veins.  - Venous reflux is noted in the right greater saphenous vein in the thigh  and proximal calf.  - Varicosities  the upper calf to mid calf area are not compressible  suggestive of a history of superficial thrombophlebitis.   Christine Acevedo is a 50 y.o. female who presents with: hx of superficial thrombophlebitis of the right GSV medial/posterior just below the right knee.    Discussed with pt that her u/s today is about the same as her previous one on 10/31/2020.  She does remember any injury to her leg, no recent surgeries or recent long car trips.  She is on norethindrone (birth control).  I discussed with her that if this is high estrogen, she may want to decrease to a low estrogen BC but she would need to discuss this further with her GYN MD.   -I did discuss with pt that given this is below the knee, the chances of this propagating to the deep system is pretty low, but if she developed chest pain or shortness of breath, that she  should get to the ER.  She expressed understanding. -discussed she can take a baby asa daily.  She can take NSAID and continue warm compresses for symptomatic relief. -pt does have venous reflux on her study today and discussed with her should she ever develop any symptoms, we could see her back.   -discussed with pt given she is on her feet a great deal, she may want to consider wearing compression socks, but she may not tolerate them at this point. -discussed the importance of leg elevation and how to elevate.    -discussed importance of weight loss -hand out was given.  -pt will f/u as needed  Leontine Locket, [facility] 12/10/2020 3:26 PM  Clinic MD:  Trula Slade   Addendum:  Discussed pt with Dr. Donzetta Matters.  Given that she is on birth control, this could be why she has superficial thrombophlebitis, however, given her age, I discussed with pt to keep up with her yearly screenings.  She does have family hx of breast cancer and gets a yearly mammogram.  Also, recommendations for first colonoscopy have changed and she is in the process of scheduling this as well.  She has appt with her GYN in April, but is going to call sooner to discuss her birth control.  She will call [place] back and follow up as needed.  Leontine Locket, [facility] 12/12/2020 3:57 PM

## 2020-12-12 ENCOUNTER — Other Ambulatory Visit: Payer: Self-pay | Admitting: Family Medicine

## 2020-12-12 DIAGNOSIS — Z1231 Encounter for screening mammogram for malignant neoplasm of breast: Secondary | ICD-10-CM

## 2021-01-15 ENCOUNTER — Other Ambulatory Visit: Payer: Self-pay

## 2021-01-15 ENCOUNTER — Ambulatory Visit
Admission: RE | Admit: 2021-01-15 | Discharge: 2021-01-15 | Disposition: A | Discharge status: Home / Self Care | Payer: PRIVATE HEALTH INSURANCE | Source: Ambulatory Visit

## 2021-01-15 DIAGNOSIS — Z1231 Encounter for screening mammogram for malignant neoplasm of breast: Secondary | ICD-10-CM

## 2021-12-13 ENCOUNTER — Other Ambulatory Visit: Payer: Self-pay | Admitting: Obstetrics and Gynecology

## 2021-12-13 DIAGNOSIS — Z1231 Encounter for screening mammogram for malignant neoplasm of breast: Secondary | ICD-10-CM

## 2021-12-25 ENCOUNTER — Other Ambulatory Visit: Payer: Self-pay | Admitting: Family Medicine

## 2021-12-25 DIAGNOSIS — Z8249 Family history of ischemic heart disease and other diseases of the circulatory system: Secondary | ICD-10-CM

## 2022-01-16 ENCOUNTER — Ambulatory Visit: Payer: PRIVATE HEALTH INSURANCE

## 2022-01-17 ENCOUNTER — Other Ambulatory Visit: Payer: PRIVATE HEALTH INSURANCE

## 2022-01-28 ENCOUNTER — Ambulatory Visit
Admission: RE | Admit: 2022-01-28 | Discharge: 2022-01-28 | Disposition: A | Discharge status: Home / Self Care | Payer: PRIVATE HEALTH INSURANCE | Source: Ambulatory Visit | Attending: Obstetrics and Gynecology | Admitting: Obstetrics and Gynecology

## 2022-01-28 DIAGNOSIS — Z1231 Encounter for screening mammogram for malignant neoplasm of breast: Secondary | ICD-10-CM

## 2022-01-31 ENCOUNTER — Ambulatory Visit
Admission: RE | Admit: 2022-01-31 | Discharge: 2022-01-31 | Disposition: A | Discharge status: Home / Self Care | Payer: No Typology Code available for payment source | Source: Ambulatory Visit | Attending: Family Medicine | Admitting: Family Medicine

## 2022-01-31 DIAGNOSIS — Z8249 Family history of ischemic heart disease and other diseases of the circulatory system: Secondary | ICD-10-CM

## 2022-12-11 ENCOUNTER — Other Ambulatory Visit: Payer: Self-pay | Admitting: Obstetrics and Gynecology

## 2022-12-11 DIAGNOSIS — Z1231 Encounter for screening mammogram for malignant neoplasm of breast: Secondary | ICD-10-CM

## 2023-01-30 ENCOUNTER — Ambulatory Visit
Admission: RE | Admit: 2023-01-30 | Discharge: 2023-01-30 | Disposition: A | Discharge status: Home / Self Care | Payer: No Typology Code available for payment source | Source: Ambulatory Visit | Attending: Obstetrics and Gynecology | Admitting: Obstetrics and Gynecology

## 2023-01-30 DIAGNOSIS — Z1231 Encounter for screening mammogram for malignant neoplasm of breast: Secondary | ICD-10-CM

## 2023-02-19 ENCOUNTER — Other Ambulatory Visit (HOSPITAL_COMMUNITY): Payer: Self-pay

## 2023-02-19 MED ORDER — TRULICITY 1.5 MG/0.5ML ~~LOC~~ SOAJ
1.5000 mg | SUBCUTANEOUS | 0 refills | Status: AC
  Filled 2023-02-19: qty 2, 28d supply, fill #0

## 2023-03-02 ENCOUNTER — Other Ambulatory Visit (HOSPITAL_COMMUNITY): Payer: Self-pay

## 2023-05-05 ENCOUNTER — Other Ambulatory Visit (HOSPITAL_COMMUNITY): Payer: Self-pay

## 2023-05-05 MED ORDER — MOUNJARO 2.5 MG/0.5ML ~~LOC~~ SOAJ
2.5000 mg | SUBCUTANEOUS | 1 refills | Status: AC
  Filled 2023-05-05: qty 2, 28d supply, fill #0
  Filled 2023-05-31: qty 2, 28d supply, fill #1

## 2023-06-01 ENCOUNTER — Other Ambulatory Visit (HOSPITAL_COMMUNITY): Payer: Self-pay

## 2023-06-02 ENCOUNTER — Other Ambulatory Visit (HOSPITAL_COMMUNITY): Payer: Self-pay

## 2023-06-24 ENCOUNTER — Other Ambulatory Visit (HOSPITAL_COMMUNITY): Payer: Self-pay

## 2023-06-24 MED ORDER — MOUNJARO 5 MG/0.5ML ~~LOC~~ SOAJ
5.0000 mg | SUBCUTANEOUS | 2 refills | Status: AC
  Filled 2023-06-24: qty 2, 28d supply, fill #0
  Filled 2023-07-29: qty 2, 28d supply, fill #1

## 2023-07-29 ENCOUNTER — Other Ambulatory Visit: Payer: Self-pay

## 2023-08-26 ENCOUNTER — Other Ambulatory Visit (HOSPITAL_COMMUNITY): Payer: Self-pay

## 2023-08-26 MED ORDER — MOUNJARO 7.5 MG/0.5ML ~~LOC~~ SOAJ
7.5000 mg | SUBCUTANEOUS | 2 refills | Status: DC
  Filled 2023-08-26: qty 2, 28d supply, fill #0
  Filled 2023-10-05: qty 2, 28d supply, fill #1
  Filled 2023-10-31: qty 2, 28d supply, fill #2

## 2023-10-05 ENCOUNTER — Other Ambulatory Visit (HOSPITAL_COMMUNITY): Payer: Self-pay

## 2023-11-02 ENCOUNTER — Other Ambulatory Visit (HOSPITAL_COMMUNITY): Payer: Self-pay

## 2023-12-08 ENCOUNTER — Other Ambulatory Visit (HOSPITAL_COMMUNITY): Payer: Self-pay

## 2023-12-08 MED ORDER — MOUNJARO 7.5 MG/0.5ML ~~LOC~~ SOAJ
7.5000 mg | SUBCUTANEOUS | 1 refills | Status: DC
  Filled 2023-12-08: qty 2, 28d supply, fill #0
  Filled 2024-01-08: qty 2, 28d supply, fill #1

## 2023-12-09 ENCOUNTER — Other Ambulatory Visit (HOSPITAL_COMMUNITY): Payer: Self-pay

## 2023-12-31 ENCOUNTER — Other Ambulatory Visit: Payer: Self-pay | Admitting: Internal Medicine

## 2023-12-31 DIAGNOSIS — Z1231 Encounter for screening mammogram for malignant neoplasm of breast: Secondary | ICD-10-CM

## 2024-01-08 ENCOUNTER — Other Ambulatory Visit (HOSPITAL_COMMUNITY): Payer: Self-pay

## 2024-02-02 ENCOUNTER — Ambulatory Visit
Admission: RE | Admit: 2024-02-02 | Discharge: 2024-02-02 | Disposition: A | Discharge status: Home / Self Care | Source: Ambulatory Visit | Attending: Internal Medicine | Admitting: Internal Medicine

## 2024-02-02 DIAGNOSIS — Z1231 Encounter for screening mammogram for malignant neoplasm of breast: Secondary | ICD-10-CM

## 2024-02-08 ENCOUNTER — Other Ambulatory Visit (HOSPITAL_COMMUNITY): Payer: Self-pay

## 2024-02-08 MED ORDER — MOUNJARO 7.5 MG/0.5ML ~~LOC~~ SOAJ
7.5000 mg | SUBCUTANEOUS | 1 refills | Status: AC
  Filled 2024-02-08: qty 2, 28d supply, fill #0

## 2024-03-07 ENCOUNTER — Other Ambulatory Visit (HOSPITAL_COMMUNITY): Payer: Self-pay

## 2024-03-07 MED ORDER — MOUNJARO 10 MG/0.5ML ~~LOC~~ SOAJ
10.0000 mg | SUBCUTANEOUS | 0 refills | Status: DC
  Filled 2024-03-07: qty 2, 28d supply, fill #0

## 2024-03-10 ENCOUNTER — Other Ambulatory Visit (HOSPITAL_COMMUNITY): Payer: Self-pay

## 2024-04-11 ENCOUNTER — Other Ambulatory Visit (HOSPITAL_COMMUNITY): Payer: Self-pay

## 2024-04-11 MED ORDER — MOUNJARO 10 MG/0.5ML ~~LOC~~ SOAJ
10.0000 mg | SUBCUTANEOUS | 5 refills | Status: DC
  Filled 2024-04-11: qty 2, 28d supply, fill #0
  Filled 2024-05-11: qty 2, 28d supply, fill #1
  Filled 2024-06-06: qty 2, 28d supply, fill #2
  Filled 2024-07-08: qty 2, 28d supply, fill #3
  Filled 2024-07-29: qty 2, 28d supply, fill #4
  Filled 2024-09-04: qty 2, 28d supply, fill #5

## 2024-05-11 ENCOUNTER — Other Ambulatory Visit (HOSPITAL_COMMUNITY): Payer: Self-pay

## 2024-06-06 ENCOUNTER — Other Ambulatory Visit (HOSPITAL_COMMUNITY): Payer: Self-pay

## 2024-06-10 ENCOUNTER — Other Ambulatory Visit (HOSPITAL_COMMUNITY): Payer: Self-pay

## 2024-07-08 ENCOUNTER — Other Ambulatory Visit (HOSPITAL_COMMUNITY): Payer: Self-pay

## 2024-07-14 ENCOUNTER — Ambulatory Visit: Admitting: Orthopedic Surgery

## 2024-07-14 ENCOUNTER — Other Ambulatory Visit (INDEPENDENT_AMBULATORY_CARE_PROVIDER_SITE_OTHER): Payer: Self-pay

## 2024-07-14 VITALS — BP 124/84 | HR 80 | Ht 59.0 in | Wt 254.0 lb

## 2024-07-14 DIAGNOSIS — M545 Low back pain, unspecified: Secondary | ICD-10-CM

## 2024-07-14 MED ORDER — PREGABALIN 75 MG PO CAPS: 75.0000 mg | ORAL_CAPSULE | Freq: Two times a day (BID) | ORAL | 1 refills | Status: DC

## 2024-07-14 NOTE — Progress Notes (Signed)
 Orthopedic Spine Surgery Office Note  Assessment: Patient is a 53 y.o. female with low back pain that radiates into the left buttock and left posterior proximal thigh. Also, having numbness in the left lower extremity. Possible radiculopathy   Plan: -Explained that initially conservative treatment is tried as a significant number of patients may experience relief with these treatment modalities. Discussed that the conservative treatments include:  -activity modification  -physical therapy  -over the counter pain medications  -medrol dosepak  -lumbar steroid injections -Patient has tried tylenol, meloxicam, intramuscular steroid injection  -Prescribed lyrica  as an additional medication to try -If she is not doing any better at her next visit, will order an MRI to evaluate for radiculopathy -Discussed weight loss with her.  She has lost 35 pounds recently and is going to continue to work on -Patient should return to office in 6 weeks, x-rays at next visit: none   Patient expressed understanding of the plan and all questions were answered to the patient's satisfaction.   ___________________________________________________________________________   History:  Patient is a 53 y.o. female who presents today for lumbar spine.  Patient has had little over a year of low back pain that radiates into the left buttock and left posterior proximal thigh.  It has been progressively worse with time.  She also notes numbness in her left lower extremity.  She feels it in multiple distributions.  She has no pain rating into the right lower extremity.  There is no trauma or injury that the onset of pain the pain recently got worse to the point that she had it evaluated by another provider and has tried several medications and a intramuscular injection which did not provide her with lasting relief.   Weakness: Denies Symptoms of imbalance: Denies Paresthesias and numbness: Yes, decreased sensation numbness  in the left lower extremity.  No other numbness or paresthesias Bowel or bladder incontinence: Denies Saddle anesthesia: Denies  Treatments tried: tylenol, meloxicam, intramuscular steroid injection  Review of systems: Denies fevers and chills, night sweats, unexplained weight loss, history of cancer. Has had pain that wakes her up at night  Past medical history: HTN DM (last A1c was 6.4) GERD  Allergies: NKDA  Past surgical history:  Cholecystectomy Pilonidal cyst excision  Social history: Denies use of nicotine product (smoking, vaping, patches, smokeless) Alcohol use: Yes, approximately 2-3 drinks per month Denies recreational drug use   Physical Exam:  BMI of 51.3  General: no acute distress, appears stated age Neurologic: alert, answering questions appropriately, following commands Respiratory: unlabored breathing on room air, symmetric chest rise Psychiatric: appropriate affect, normal cadence to speech   MSK (spine):  -Strength exam      Left  Right EHL    5/5  5/5 TA    5/5  5/5 GSC    5/5  5/5 Knee extension  5/5  5/5 Hip flexion   5/5  5/5  -Sensory exam    Sensation intact to light touch in L3-S1 nerve distributions of bilateral lower extremities  -Achilles DTR: 1/4 on the left, 1/4 on the right -Patellar tendon DTR: 1/4 on the left, 1/4 on the right  -Straight leg raise: negative bilaterally -Clonus: no beats bilaterally  -Left hip exam: no pain through range of motion -Right hip exam: no pain through range of motion  Imaging: XRs of the lumbar spine from 07/14/2024 were independently reviewed and interpreted, showing disc height loss and grade 1 spondylolisthesis at L4/5.  No other significant degenerative changes seen.  No fracture or dislocation seen.   Patient name: Christine Acevedo Patient MRN: [id number] Date of visit: 07/14/24

## 2024-07-29 ENCOUNTER — Other Ambulatory Visit (HOSPITAL_COMMUNITY): Payer: Self-pay

## 2024-08-25 ENCOUNTER — Ambulatory Visit: Admitting: Orthopedic Surgery

## 2024-08-25 VITALS — Wt 252.0 lb

## 2024-08-25 DIAGNOSIS — M5416 Radiculopathy, lumbar region: Secondary | ICD-10-CM | POA: Diagnosis not present

## 2024-08-25 NOTE — Progress Notes (Signed)
 Orthopedic Spine Surgery Office Note   Assessment: Patient is a 53 y.o. female with low back pain that radiates into bilateral lower extremities.  She feels going into the left buttock and left posterior thigh.  On the right, goes into the lateral thigh and leg.     Plan: -Patient has tried tylenol, meloxicam, intramuscular steroid injection, lyrica  -Patient has now tried over 6 weeks of conservative treatment under me without any relief of her symptoms, so recommended an MRI of the lumbar spine to evaluate for radiculopathy -Discussed injection as a possible next nonoperative treatment to try -Patient wanted to be notified via MyChart about her MRI and next steps in treatment     Patient expressed understanding of the plan and all questions were answered to the patient's satisfaction.    ___________________________________________________________________________     History:   Patient is a 53 y.o. female who presents today for follow up on her lumbar spine.  Patient continues to have low back pain that radiates into the bilateral paraspinal muscles.  She has also had pain that radiates into the left buttock and left posterior proximal thigh.  Since her last visit, she did a trip to [facility] and has noticed onset of right lower extremity pain.  She feels it along the lateral aspect of the thigh and leg.  Her numbness has gotten better since starting Lyrica .  No bowel or bladder incontinence.  No saddle anesthesia.  Treatments tried: tylenol, meloxicam, intramuscular steroid injection, lyrica     Physical Exam:   General: no acute distress, appears stated age Neurologic: alert, answering questions appropriately, following commands Respiratory: unlabored breathing on room air, symmetric chest rise Psychiatric: appropriate affect, normal cadence to speech     MSK (spine):   -Strength exam                                                   Left                  Right EHL                               5/5                  5/5 TA                                 5/5                  5/5 GSC                             5/5                  5/5 Knee extension            5/5                  5/5 Hip flexion                    5/5                  5/5   -Sensory exam  Sensation intact to light touch in L3-S1 nerve distributions of bilateral lower extremities     Imaging: XRs of the lumbar spine from 07/14/2024 were independently reviewed and interpreted, showing disc height loss and grade 1 spondylolisthesis at L4/5.  No other significant degenerative changes seen.  No fracture or dislocation seen.     Patient name: Christine Acevedo Patient MRN: [id number] Date of visit: 08/25/24

## 2024-08-29 ENCOUNTER — Encounter: Payer: Self-pay | Admitting: Radiology

## 2024-09-05 ENCOUNTER — Other Ambulatory Visit (HOSPITAL_COMMUNITY): Payer: Self-pay

## 2024-09-13 ENCOUNTER — Encounter: Payer: Self-pay | Admitting: Orthopedic Surgery

## 2024-09-16 ENCOUNTER — Encounter: Payer: Self-pay | Admitting: Orthopedic Surgery

## 2024-09-16 MED ORDER — PREGABALIN 75 MG PO CAPS: 75.0000 mg | ORAL_CAPSULE | Freq: Two times a day (BID) | ORAL | 2 refills | Status: AC

## 2024-09-17 ENCOUNTER — Ambulatory Visit
Admission: RE | Admit: 2024-09-17 | Discharge: 2024-09-17 | Disposition: A | Discharge status: Home / Self Care | Source: Ambulatory Visit | Attending: Orthopedic Surgery | Admitting: Orthopedic Surgery

## 2024-09-17 DIAGNOSIS — M5416 Radiculopathy, lumbar region: Secondary | ICD-10-CM

## 2024-09-21 ENCOUNTER — Encounter: Payer: Self-pay | Admitting: Orthopedic Surgery

## 2024-10-06 ENCOUNTER — Other Ambulatory Visit (HOSPITAL_COMMUNITY): Payer: Self-pay

## 2024-10-07 ENCOUNTER — Other Ambulatory Visit (HOSPITAL_COMMUNITY): Payer: Self-pay

## 2024-10-07 MED ORDER — MOUNJARO 10 MG/0.5ML ~~LOC~~ SOAJ
10.0000 mg | SUBCUTANEOUS | 5 refills | Status: AC
  Filled 2024-10-07: qty 2, 28d supply, fill #0
  Filled 2024-11-02: qty 2, 28d supply, fill #1
  Filled 2024-11-30: qty 2, 28d supply, fill #2

## 2024-11-02 ENCOUNTER — Other Ambulatory Visit (HOSPITAL_COMMUNITY): Payer: Self-pay

## 2024-11-08 ENCOUNTER — Encounter: Payer: Self-pay | Admitting: Orthopedic Surgery

## 2024-11-23 ENCOUNTER — Other Ambulatory Visit (HOSPITAL_COMMUNITY): Payer: Self-pay

## 2024-11-23 MED ORDER — MOUNJARO 10 MG/0.5ML ~~LOC~~ SOAJ: 10.0000 mg | SUBCUTANEOUS | 1 refills | Status: AC

## 2024-11-30 ENCOUNTER — Other Ambulatory Visit (HOSPITAL_COMMUNITY): Payer: Self-pay
